# Patient Record
Sex: Female | Born: 2005 | Race: White | Hispanic: No | Marital: Single | Smoking: Never smoker
Health system: Southern US, Community
[De-identification: ages and names within clinical notes are randomized; demographics above are authoritative.]

---
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
## 2024-01-04 DIAGNOSIS — M9905 Segmental and somatic dysfunction of pelvic region: Secondary | ICD-10-CM | POA: Diagnosis not present

## 2024-01-04 DIAGNOSIS — M62830 Muscle spasm of back: Secondary | ICD-10-CM | POA: Diagnosis not present

## 2024-01-04 DIAGNOSIS — M5416 Radiculopathy, lumbar region: Secondary | ICD-10-CM | POA: Diagnosis not present

## 2024-01-04 DIAGNOSIS — M9903 Segmental and somatic dysfunction of lumbar region: Secondary | ICD-10-CM | POA: Diagnosis not present

## 2024-02-29 DIAGNOSIS — M5416 Radiculopathy, lumbar region: Secondary | ICD-10-CM | POA: Diagnosis not present

## 2024-02-29 DIAGNOSIS — M62830 Muscle spasm of back: Secondary | ICD-10-CM | POA: Diagnosis not present

## 2024-02-29 DIAGNOSIS — M9903 Segmental and somatic dysfunction of lumbar region: Secondary | ICD-10-CM | POA: Diagnosis not present

## 2024-02-29 DIAGNOSIS — M9905 Segmental and somatic dysfunction of pelvic region: Secondary | ICD-10-CM | POA: Diagnosis not present

## 2024-04-25 DIAGNOSIS — M62830 Muscle spasm of back: Secondary | ICD-10-CM | POA: Diagnosis not present

## 2024-04-25 DIAGNOSIS — M5416 Radiculopathy, lumbar region: Secondary | ICD-10-CM | POA: Diagnosis not present

## 2024-04-25 DIAGNOSIS — M9903 Segmental and somatic dysfunction of lumbar region: Secondary | ICD-10-CM | POA: Diagnosis not present

## 2024-04-25 DIAGNOSIS — M9905 Segmental and somatic dysfunction of pelvic region: Secondary | ICD-10-CM | POA: Diagnosis not present

## 2024-06-06 DIAGNOSIS — M9903 Segmental and somatic dysfunction of lumbar region: Secondary | ICD-10-CM | POA: Diagnosis not present

## 2024-06-06 DIAGNOSIS — M5416 Radiculopathy, lumbar region: Secondary | ICD-10-CM | POA: Diagnosis not present

## 2024-06-06 DIAGNOSIS — M9905 Segmental and somatic dysfunction of pelvic region: Secondary | ICD-10-CM | POA: Diagnosis not present

## 2024-06-06 DIAGNOSIS — M62830 Muscle spasm of back: Secondary | ICD-10-CM | POA: Diagnosis not present

## 2024-06-18 DIAGNOSIS — Z0001 Encounter for general adult medical examination with abnormal findings: Secondary | ICD-10-CM | POA: Diagnosis not present

## 2024-06-18 DIAGNOSIS — Z68.41 Body mass index (BMI) pediatric, 85th percentile to less than 95th percentile for age: Secondary | ICD-10-CM | POA: Diagnosis not present

## 2024-06-18 DIAGNOSIS — Z3046 Encounter for surveillance of implantable subdermal contraceptive: Secondary | ICD-10-CM | POA: Diagnosis not present

## 2024-06-18 DIAGNOSIS — Z113 Encounter for screening for infections with a predominantly sexual mode of transmission: Secondary | ICD-10-CM | POA: Diagnosis not present

## 2024-06-18 DIAGNOSIS — Z713 Dietary counseling and surveillance: Secondary | ICD-10-CM | POA: Diagnosis not present

## 2024-06-18 DIAGNOSIS — M4120 Other idiopathic scoliosis, site unspecified: Secondary | ICD-10-CM | POA: Diagnosis not present

## 2024-06-18 DIAGNOSIS — Z0000 Encounter for general adult medical examination without abnormal findings: Secondary | ICD-10-CM | POA: Diagnosis not present

## 2024-06-18 DIAGNOSIS — Z1330 Encounter for screening examination for mental health and behavioral disorders, unspecified: Secondary | ICD-10-CM | POA: Diagnosis not present

## 2024-06-18 DIAGNOSIS — Z7189 Other specified counseling: Secondary | ICD-10-CM | POA: Diagnosis not present

## 2024-06-18 DIAGNOSIS — L409 Psoriasis, unspecified: Secondary | ICD-10-CM | POA: Diagnosis not present

## 2024-06-19 DIAGNOSIS — Z0000 Encounter for general adult medical examination without abnormal findings: Secondary | ICD-10-CM | POA: Diagnosis not present

## 2024-06-19 DIAGNOSIS — Z13220 Encounter for screening for lipoid disorders: Secondary | ICD-10-CM | POA: Diagnosis not present

## 2024-07-28 DIAGNOSIS — Z79899 Other long term (current) drug therapy: Secondary | ICD-10-CM | POA: Diagnosis not present

## 2024-07-28 DIAGNOSIS — L400 Psoriasis vulgaris: Secondary | ICD-10-CM | POA: Diagnosis not present

## 2024-07-29 DIAGNOSIS — S61306A Unspecified open wound of right little finger with damage to nail, initial encounter: Secondary | ICD-10-CM | POA: Diagnosis not present

## 2024-07-29 DIAGNOSIS — W2106XA Struck by volleyball, initial encounter: Secondary | ICD-10-CM | POA: Diagnosis not present
